# Patient Record
Sex: Male | Born: 1970 | Race: White | Hispanic: No | Marital: Married | Smoking: Former smoker
Health system: Southern US, Community
[De-identification: ages and names within clinical notes are randomized; demographics above are authoritative.]

## PROBLEM LIST (undated history)

## (undated) DIAGNOSIS — N200 Calculus of kidney: Secondary | ICD-10-CM

## (undated) DIAGNOSIS — K219 Gastro-esophageal reflux disease without esophagitis: Secondary | ICD-10-CM

## (undated) DIAGNOSIS — F329 Major depressive disorder, single episode, unspecified: Secondary | ICD-10-CM

## (undated) DIAGNOSIS — F32A Depression, unspecified: Secondary | ICD-10-CM

## (undated) DIAGNOSIS — F419 Anxiety disorder, unspecified: Secondary | ICD-10-CM

## (undated) DIAGNOSIS — T7840XA Allergy, unspecified, initial encounter: Secondary | ICD-10-CM

## (undated) HISTORY — DX: Major depressive disorder, single episode, unspecified: F32.9

## (undated) HISTORY — DX: Allergy, unspecified, initial encounter: T78.40XA

## (undated) HISTORY — DX: Depression, unspecified: F32.A

## (undated) HISTORY — DX: Anxiety disorder, unspecified: F41.9

---

## 2000-11-09 ENCOUNTER — Encounter: Payer: Self-pay | Admitting: Emergency Medicine

## 2000-11-09 ENCOUNTER — Emergency Department (HOSPITAL_COMMUNITY): Admission: EM | Admit: 2000-11-09 | Discharge: 2000-11-09 | Payer: Self-pay | Admitting: Emergency Medicine

## 2002-06-15 ENCOUNTER — Encounter: Payer: Self-pay | Admitting: Emergency Medicine

## 2002-06-15 ENCOUNTER — Emergency Department (HOSPITAL_COMMUNITY): Admission: EM | Admit: 2002-06-15 | Discharge: 2002-06-15 | Payer: Self-pay | Admitting: Emergency Medicine

## 2002-06-17 ENCOUNTER — Emergency Department (HOSPITAL_COMMUNITY): Admission: EM | Admit: 2002-06-17 | Discharge: 2002-06-17 | Payer: Self-pay | Admitting: Emergency Medicine

## 2003-11-25 ENCOUNTER — Emergency Department (HOSPITAL_COMMUNITY): Admission: EM | Admit: 2003-11-25 | Discharge: 2003-11-26 | Payer: Self-pay | Admitting: Emergency Medicine

## 2005-12-05 ENCOUNTER — Emergency Department (HOSPITAL_COMMUNITY): Admission: EM | Admit: 2005-12-05 | Discharge: 2005-12-05 | Payer: Self-pay | Admitting: Emergency Medicine

## 2005-12-06 ENCOUNTER — Emergency Department (HOSPITAL_COMMUNITY): Admission: EM | Admit: 2005-12-06 | Discharge: 2005-12-06 | Payer: Self-pay | Admitting: Emergency Medicine

## 2007-05-09 ENCOUNTER — Emergency Department: Payer: Self-pay | Admitting: Emergency Medicine

## 2009-12-19 ENCOUNTER — Emergency Department (HOSPITAL_COMMUNITY): Admission: EM | Admit: 2009-12-19 | Discharge: 2009-12-19 | Payer: Self-pay | Admitting: Emergency Medicine

## 2010-05-31 ENCOUNTER — Inpatient Hospital Stay (INDEPENDENT_AMBULATORY_CARE_PROVIDER_SITE_OTHER)
Admission: RE | Admit: 2010-05-31 | Discharge: 2010-05-31 | Disposition: A | Discharge status: Home / Self Care | Payer: Self-pay | Source: Ambulatory Visit | Attending: Family Medicine | Admitting: Family Medicine

## 2010-05-31 DIAGNOSIS — J309 Allergic rhinitis, unspecified: Secondary | ICD-10-CM

## 2010-05-31 DIAGNOSIS — J029 Acute pharyngitis, unspecified: Secondary | ICD-10-CM

## 2010-07-18 ENCOUNTER — Emergency Department (HOSPITAL_COMMUNITY)
Admission: EM | Admit: 2010-07-18 | Discharge: 2010-07-18 | Disposition: A | Discharge status: Home / Self Care | Payer: Self-pay | Attending: Emergency Medicine | Admitting: Emergency Medicine

## 2010-07-18 DIAGNOSIS — L298 Other pruritus: Secondary | ICD-10-CM | POA: Insufficient documentation

## 2010-07-18 DIAGNOSIS — L02419 Cutaneous abscess of limb, unspecified: Secondary | ICD-10-CM | POA: Insufficient documentation

## 2010-07-18 DIAGNOSIS — G8929 Other chronic pain: Secondary | ICD-10-CM | POA: Insufficient documentation

## 2010-07-18 DIAGNOSIS — L2989 Other pruritus: Secondary | ICD-10-CM | POA: Insufficient documentation

## 2010-07-18 DIAGNOSIS — R197 Diarrhea, unspecified: Secondary | ICD-10-CM | POA: Insufficient documentation

## 2010-07-18 DIAGNOSIS — M79609 Pain in unspecified limb: Secondary | ICD-10-CM | POA: Insufficient documentation

## 2010-07-18 DIAGNOSIS — R112 Nausea with vomiting, unspecified: Secondary | ICD-10-CM | POA: Insufficient documentation

## 2010-07-18 DIAGNOSIS — M549 Dorsalgia, unspecified: Secondary | ICD-10-CM | POA: Insufficient documentation

## 2010-07-18 DIAGNOSIS — L03119 Cellulitis of unspecified part of limb: Secondary | ICD-10-CM | POA: Insufficient documentation

## 2010-07-18 DIAGNOSIS — R42 Dizziness and giddiness: Secondary | ICD-10-CM | POA: Insufficient documentation

## 2010-07-18 DIAGNOSIS — R21 Rash and other nonspecific skin eruption: Secondary | ICD-10-CM | POA: Insufficient documentation

## 2010-08-11 ENCOUNTER — Ambulatory Visit (INDEPENDENT_AMBULATORY_CARE_PROVIDER_SITE_OTHER): Payer: Self-pay

## 2010-08-11 ENCOUNTER — Inpatient Hospital Stay (INDEPENDENT_AMBULATORY_CARE_PROVIDER_SITE_OTHER)
Admission: RE | Admit: 2010-08-11 | Discharge: 2010-08-11 | Disposition: A | Discharge status: Home / Self Care | Payer: Self-pay | Source: Ambulatory Visit | Attending: Family Medicine | Admitting: Family Medicine

## 2010-08-11 DIAGNOSIS — R498 Other voice and resonance disorders: Secondary | ICD-10-CM

## 2010-08-11 DIAGNOSIS — S60229A Contusion of unspecified hand, initial encounter: Secondary | ICD-10-CM

## 2010-11-15 ENCOUNTER — Encounter (HOSPITAL_COMMUNITY): Payer: Self-pay | Admitting: Radiology

## 2010-11-15 ENCOUNTER — Emergency Department (HOSPITAL_COMMUNITY)
Admission: EM | Admit: 2010-11-15 | Discharge: 2010-11-15 | Disposition: A | Discharge status: Home / Self Care | Payer: Medicaid Other | Attending: Emergency Medicine | Admitting: Emergency Medicine

## 2010-11-15 ENCOUNTER — Emergency Department (HOSPITAL_COMMUNITY): Payer: Medicaid Other

## 2010-11-15 DIAGNOSIS — M545 Low back pain, unspecified: Secondary | ICD-10-CM | POA: Insufficient documentation

## 2010-11-15 DIAGNOSIS — R209 Unspecified disturbances of skin sensation: Secondary | ICD-10-CM | POA: Insufficient documentation

## 2010-11-15 DIAGNOSIS — R51 Headache: Secondary | ICD-10-CM | POA: Insufficient documentation

## 2010-11-15 DIAGNOSIS — R112 Nausea with vomiting, unspecified: Secondary | ICD-10-CM | POA: Insufficient documentation

## 2010-11-15 DIAGNOSIS — R42 Dizziness and giddiness: Secondary | ICD-10-CM | POA: Insufficient documentation

## 2010-11-15 DIAGNOSIS — R231 Pallor: Secondary | ICD-10-CM | POA: Insufficient documentation

## 2010-11-15 DIAGNOSIS — R61 Generalized hyperhidrosis: Secondary | ICD-10-CM | POA: Insufficient documentation

## 2010-11-15 DIAGNOSIS — G8929 Other chronic pain: Secondary | ICD-10-CM | POA: Insufficient documentation

## 2010-11-15 DIAGNOSIS — R109 Unspecified abdominal pain: Secondary | ICD-10-CM | POA: Insufficient documentation

## 2010-11-15 HISTORY — DX: Calculus of kidney: N20.0

## 2010-11-15 LAB — DIFFERENTIAL
Basophils Absolute: 0 10*3/uL (ref 0.0–0.1)
Basophils Relative: 0 % (ref 0–1)
Eosinophils Absolute: 0.1 10*3/uL (ref 0.0–0.7)
Eosinophils Relative: 1 % (ref 0–5)
Lymphocytes Relative: 6 % — ABNORMAL LOW (ref 12–46)
Lymphs Abs: 1 10*3/uL (ref 0.7–4.0)
Monocytes Absolute: 1.1 10*3/uL — ABNORMAL HIGH (ref 0.1–1.0)
Monocytes Relative: 7 % (ref 3–12)
Neutro Abs: 13.6 10*3/uL — ABNORMAL HIGH (ref 1.7–7.7)
Neutrophils Relative %: 86 % — ABNORMAL HIGH (ref 43–77)

## 2010-11-15 LAB — CBC
HCT: 42.2 % (ref 39.0–52.0)
Hemoglobin: 15 g/dL (ref 13.0–17.0)
MCH: 31 pg (ref 26.0–34.0)
MCHC: 35.5 g/dL (ref 30.0–36.0)
MCV: 87.2 fL (ref 78.0–100.0)
Platelets: 271 10*3/uL (ref 150–400)
RBC: 4.84 MIL/uL (ref 4.22–5.81)
RDW: 12.1 % (ref 11.5–15.5)
WBC: 15.8 10*3/uL — ABNORMAL HIGH (ref 4.0–10.5)

## 2010-11-15 LAB — URINALYSIS, ROUTINE W REFLEX MICROSCOPIC
Bilirubin Urine: NEGATIVE
Glucose, UA: NEGATIVE mg/dL
Hgb urine dipstick: NEGATIVE
Ketones, ur: NEGATIVE mg/dL
Leukocytes, UA: NEGATIVE
Nitrite: NEGATIVE
Protein, ur: NEGATIVE mg/dL
Specific Gravity, Urine: 1.018 (ref 1.005–1.030)
Urobilinogen, UA: 0.2 mg/dL (ref 0.0–1.0)
pH: 8.5 — ABNORMAL HIGH (ref 5.0–8.0)

## 2010-11-15 LAB — BASIC METABOLIC PANEL
BUN: 13 mg/dL (ref 6–23)
CO2: 26 mEq/L (ref 19–32)
Calcium: 9.5 mg/dL (ref 8.4–10.5)
Chloride: 101 mEq/L (ref 96–112)
Creatinine, Ser: 0.82 mg/dL (ref 0.50–1.35)
GFR calc Af Amer: >60 mL/min (ref >60)
GFR calc non Af Amer: >60 mL/min (ref >60)
Glucose, Bld: 107 mg/dL — ABNORMAL HIGH (ref 70–99)
Potassium: 4.2 mEq/L (ref 3.5–5.1)
Sodium: 136 mEq/L (ref 135–145)

## 2010-11-15 MED ORDER — IOHEXOL 300 MG/ML  SOLN
100.0000 mL | Freq: Once | INTRAMUSCULAR | Status: AC | PRN
  Administered 2010-11-15: 100 mL via INTRAVENOUS

## 2011-02-19 ENCOUNTER — Encounter (HOSPITAL_COMMUNITY): Payer: Self-pay | Admitting: Emergency Medicine

## 2011-02-19 ENCOUNTER — Emergency Department (HOSPITAL_COMMUNITY): Payer: Self-pay

## 2011-02-19 ENCOUNTER — Emergency Department (HOSPITAL_COMMUNITY)
Admission: EM | Admit: 2011-02-19 | Discharge: 2011-02-19 | Disposition: A | Discharge status: Home / Self Care | Payer: Self-pay | Attending: Emergency Medicine | Admitting: Emergency Medicine

## 2011-02-19 DIAGNOSIS — W2209XA Striking against other stationary object, initial encounter: Secondary | ICD-10-CM | POA: Insufficient documentation

## 2011-02-19 DIAGNOSIS — S60229A Contusion of unspecified hand, initial encounter: Secondary | ICD-10-CM | POA: Insufficient documentation

## 2011-02-19 DIAGNOSIS — S60221A Contusion of right hand, initial encounter: Secondary | ICD-10-CM

## 2011-02-19 MED ORDER — IBUPROFEN 600 MG PO TABS: 600.0000 mg | ORAL_TABLET | Freq: Four times a day (QID) | ORAL | Status: AC | PRN

## 2011-02-19 MED ORDER — TRAMADOL HCL 50 MG PO TABS: 50.0000 mg | ORAL_TABLET | Freq: Four times a day (QID) | ORAL | Status: AC | PRN

## 2011-02-19 NOTE — ED Notes (Signed)
Pt presented to the ER with c/o right hand pain secondary to the hand injury, yesterday, pt states he punched the toilet paper stand while joking with his friends, noted swelling to the middle of the metacarpolpharngial area, pt with decreased digit range, pulses present and strong bilaterally. Cap refill <3 sec

## 2011-02-19 NOTE — ED Provider Notes (Signed)
History     CSN: [id number]  Arrival date & time 02/19/11  2022   First MD Initiated Contact with Patient 02/19/11 2053      Chief Complaint  Patient presents with  . Hand Injury  . Hand Pain    (Consider location/radiation/quality/duration/timing/severity/associated sxs/prior treatment) Patient is a 40 y.o. male presenting with hand injury. The history is provided by the patient.  Hand Injury  The incident occurred yesterday. The injury mechanism was a direct blow. The pain is present in the right hand. The quality of the pain is described as aching. The pain is at a severity of 7/10. The pain is moderate. The pain has been constant since the incident. Pertinent negatives include no malaise/fatigue. He reports no foreign bodies present. The symptoms are aggravated by movement, palpation and use. He has tried nothing for the symptoms.  Pt reports punching a toilet paper dispenser. No other injuries or complaints. Did not try any medications or icing it.  Past Medical History  Diagnosis Date  . Kidney stones     History reviewed. No pertinent past surgical history.  History reviewed. No pertinent family history.  History  Substance Use Topics  . Smoking status: Never Smoker   . Smokeless tobacco: Not on file  . Alcohol Use: No      Review of Systems  Constitutional: Negative for malaise/fatigue.  Musculoskeletal:       Hand injury  All other systems reviewed and are negative.    Allergies  Phenergan  Home Medications  No current outpatient prescriptions on file.  BP 127/79  Pulse 82  Temp(Src) 98.3 F (36.8 C) (Oral)  Resp 20  SpO2 98%  Physical Exam  Nursing note and vitals reviewed. Constitutional: He is oriented to person, place, and time. He appears well-developed and well-nourished.  HENT:  Head: Normocephalic and atraumatic.  Eyes: Conjunctivae are normal.  Neck: Neck supple.  Cardiovascular: Normal rate, regular rhythm and normal heart sounds.    Pulmonary/Chest: Effort normal and breath sounds normal. No respiratory distress.  Musculoskeletal:       Small 0.5cm abrasion to the right 3rd knucle of the mcp joint. Mild swelling over the same joint. Tender to palpation. Pain with 3rd finger flexion, extension. Normal rest of the hand and wrist exam.  Neurological: He is alert and oriented to person, place, and time.  Skin: Skin is warm and dry.  Psychiatric: He has a normal mood and affect.    ED Course  Procedures (including critical care time)  Labs Reviewed - No data to display Dg Hand Complete Right  02/19/2011  *RADIOLOGY REPORT*  Clinical Data: Punched wall, with right hand pain.  RIGHT HAND - COMPLETE 3+ VIEW  Comparison: Right hand radiographs performed 08/11/2010  Findings: There is no evidence of fracture or dislocation.  The joint spaces are preserved; the soft tissues are unremarkable in appearance.  The carpal rows are intact, and demonstrate normal alignment.  IMPRESSION: No evidence of fracture or dislocation.  Original Report Authenticated By: Tonia Ghent, M.D.     X-ray negative. ACE wrap given. Small abrasion to the knuckle. Pt denies hitting anyones face. No signs of infection. Bacitracin applied. Will d/c home.    MDM          Lottie Mussel, PA 02/19/11 2202  Lottie Mussel, PA 03/11/11 1534

## 2011-03-15 NOTE — ED Provider Notes (Signed)
Medical screening examination/treatment/procedure(s) were performed by non-physician practitioner and as supervising physician I was immediately available for consultation/collaboration.  Hurman Horn, MD 03/15/11 1230

## 2011-10-03 ENCOUNTER — Ambulatory Visit (INDEPENDENT_AMBULATORY_CARE_PROVIDER_SITE_OTHER): Payer: BC Managed Care – PPO | Admitting: Emergency Medicine

## 2011-10-03 VITALS — BP 117/77 | HR 68 | Temp 97.8°F | Resp 16 | Ht 72.0 in | Wt 209.0 lb

## 2011-10-03 DIAGNOSIS — M722 Plantar fascial fibromatosis: Secondary | ICD-10-CM

## 2011-10-03 MED ORDER — NAPROXEN SODIUM 550 MG PO TABS: 550.0000 mg | ORAL_TABLET | Freq: Two times a day (BID) | ORAL | Status: DC

## 2011-10-03 NOTE — Progress Notes (Signed)
   Date:  10/03/2011   Name:  Carlos Taylor   DOB:  [birth year]   MRN:  [id number]  PCP:  Kaleen Mask, MD    Chief Complaint: Foot Pain   History of Present Illness:  Carlos Taylor is a 40 y.o. very pleasant male patient who presents with the following:  Several month history of pain in heels when he gets up in the morning.  So severe lately that he can barely walk until he 'warms up'.  Pain diminishes with activity.   No history of injury.  There is no problem list on file for this patient.   Past Medical History  Diagnosis Date  . Kidney stones     No past surgical history on file.  History  Substance Use Topics  . Smoking status: Never Smoker   . Smokeless tobacco: Not on file  . Alcohol Use: No    No family history on file.  Allergies  Allergen Reactions  . Promethazine Hcl     Rash to the lip    Medication list has been reviewed and updated.  No current outpatient prescriptions on file prior to visit.    Review of Systems:  As per HPI, otherwise negative.    Physical Examination: Filed Vitals:   10/03/11 1958  BP: 117/77  Pulse: 68  Temp: 97.8 F (36.6 C)  Resp: 16   Filed Vitals:   10/03/11 1958  Height: 6' (1.829 m)  Weight: 209 lb (94.802 kg)   Body mass index is 28.35 kg/(m^2). Ideal Body Weight: Weight in (lb) to have BMI = 25: 183.9    GEN: WDWN, NAD, Non-toxic, Alert & Oriented x 3 HEENT: Atraumatic, Normocephalic.  Ears and Nose: No external deformity. EXTR: No clubbing/cyanosis/edema NEURO: Normal gait.  PSYCH: Normally interactive. Conversant. Not depressed or anxious appearing.  Calm demeanor.  Feet.  Marked tenderness anterior to heel.    Assessment and Plan: Plantar fasciitis Podiatry Offered injection refused Anaprox ds Follow up as needed  Carmelina Dane, MD

## 2011-10-04 ENCOUNTER — Telehealth: Payer: Self-pay

## 2011-10-04 NOTE — Telephone Encounter (Signed)
PT STATES HE WAS SEEN FOR FOOT PAIN AND HIS FEET IS STILL REALLY PAINFUL. WAS GIVEN SOME MEDICINE BUT NOT FOR THE PAIN PLEASE CALL [phone]    [facility] [address]

## 2011-10-04 NOTE — Telephone Encounter (Signed)
Pt wife called again, pt is working but in pain.

## 2011-10-05 MED ORDER — TRAMADOL HCL 50 MG PO TABS: 50.0000 mg | ORAL_TABLET | Freq: Three times a day (TID) | ORAL | Status: AC | PRN

## 2011-10-05 NOTE — Telephone Encounter (Signed)
Can we recommend anything for the pain?

## 2011-10-05 NOTE — Telephone Encounter (Signed)
Spoke with patients wife and he is taking it as directed with alittle relief.  Would like stronger meds, doesn't think Ultram will work, but advised to try it first and see.  Rx already sent in.

## 2011-10-05 NOTE — Telephone Encounter (Signed)
lmom for pt to cb

## 2011-10-05 NOTE — Telephone Encounter (Signed)
The medication he was given is both to treat the inflammation and to help with the pain. Is he taking it as directed? I can also authorize Ultram as needed for pain.

## 2011-11-05 ENCOUNTER — Encounter (HOSPITAL_COMMUNITY): Payer: Self-pay | Admitting: *Deleted

## 2011-11-05 ENCOUNTER — Emergency Department (HOSPITAL_COMMUNITY)
Admission: EM | Admit: 2011-11-05 | Discharge: 2011-11-05 | Disposition: A | Discharge status: Home / Self Care | Payer: BC Managed Care – PPO | Attending: Emergency Medicine | Admitting: Emergency Medicine

## 2011-11-05 ENCOUNTER — Emergency Department (HOSPITAL_COMMUNITY): Payer: BC Managed Care – PPO

## 2011-11-05 DIAGNOSIS — K219 Gastro-esophageal reflux disease without esophagitis: Secondary | ICD-10-CM | POA: Insufficient documentation

## 2011-11-05 DIAGNOSIS — R109 Unspecified abdominal pain: Secondary | ICD-10-CM | POA: Insufficient documentation

## 2011-11-05 DIAGNOSIS — Z87442 Personal history of urinary calculi: Secondary | ICD-10-CM | POA: Insufficient documentation

## 2011-11-05 DIAGNOSIS — N200 Calculus of kidney: Secondary | ICD-10-CM | POA: Insufficient documentation

## 2011-11-05 DIAGNOSIS — R110 Nausea: Secondary | ICD-10-CM | POA: Insufficient documentation

## 2011-11-05 DIAGNOSIS — R319 Hematuria, unspecified: Secondary | ICD-10-CM | POA: Insufficient documentation

## 2011-11-05 DIAGNOSIS — Z79899 Other long term (current) drug therapy: Secondary | ICD-10-CM | POA: Insufficient documentation

## 2011-11-05 HISTORY — DX: Gastro-esophageal reflux disease without esophagitis: K21.9

## 2011-11-05 LAB — URINALYSIS, ROUTINE W REFLEX MICROSCOPIC
Bilirubin Urine: NEGATIVE
Glucose, UA: NEGATIVE mg/dL
Ketones, ur: NEGATIVE mg/dL
Leukocytes, UA: NEGATIVE
Nitrite: NEGATIVE
Protein, ur: NEGATIVE mg/dL
Specific Gravity, Urine: 1.026 (ref 1.005–1.030)
Urobilinogen, UA: 0.2 mg/dL (ref 0.0–1.0)
pH: 5.5 (ref 5.0–8.0)

## 2011-11-05 LAB — POCT I-STAT, CHEM 8
Creatinine, Ser: 1.1 mg/dL (ref 0.50–1.35)
Glucose, Bld: 122 mg/dL — ABNORMAL HIGH (ref 70–99)
Hemoglobin: 15 g/dL (ref 13.0–17.0)
Sodium: 141 mEq/L (ref 135–145)
TCO2: 26 mmol/L (ref 0–100)

## 2011-11-05 LAB — CBC WITH DIFFERENTIAL/PLATELET
Basophils Relative: 1 % (ref 0–1)
Eosinophils Absolute: 0.3 10*3/uL (ref 0.0–0.7)
Eosinophils Relative: 5 % (ref 0–5)
Lymphs Abs: 2.6 10*3/uL (ref 0.7–4.0)
MCH: 30.3 pg (ref 26.0–34.0)
MCHC: 34.4 g/dL (ref 30.0–36.0)
MCV: 88.1 fL (ref 78.0–100.0)
Platelets: 276 10*3/uL (ref 150–400)
RBC: 4.78 MIL/uL (ref 4.22–5.81)

## 2011-11-05 LAB — URINE MICROSCOPIC-ADD ON

## 2011-11-05 MED ORDER — ONDANSETRON HCL 4 MG/2ML IJ SOLN
4.0000 mg | Freq: Once | INTRAMUSCULAR | Status: AC
  Administered 2011-11-05: 4 mg via INTRAVENOUS
  Filled 2011-11-05: qty 2

## 2011-11-05 MED ORDER — HYDROCODONE-ACETAMINOPHEN 5-500 MG PO TABS: 1.0000 | ORAL_TABLET | Freq: Four times a day (QID) | ORAL | Status: AC | PRN

## 2011-11-05 MED ORDER — KETOROLAC TROMETHAMINE 30 MG/ML IJ SOLN
30.0000 mg | Freq: Once | INTRAMUSCULAR | Status: AC
  Administered 2011-11-05: 30 mg via INTRAVENOUS
  Filled 2011-11-05: qty 1

## 2011-11-05 MED ORDER — HYDROMORPHONE HCL PF 1 MG/ML IJ SOLN
1.0000 mg | Freq: Once | INTRAMUSCULAR | Status: AC
  Administered 2011-11-05: 1 mg via INTRAVENOUS
  Filled 2011-11-05: qty 1

## 2011-11-05 MED ORDER — SODIUM CHLORIDE 0.9 % IV BOLUS (SEPSIS)
1000.0000 mL | Freq: Once | INTRAVENOUS | Status: AC
  Administered 2011-11-05: 1000 mL via INTRAVENOUS

## 2011-11-05 MED ORDER — IBUPROFEN 800 MG PO TABS: 800.0000 mg | ORAL_TABLET | Freq: Three times a day (TID) | ORAL | Status: AC

## 2011-11-05 MED ORDER — TAMSULOSIN HCL 0.4 MG PO CAPS: 0.4000 mg | ORAL_CAPSULE | Freq: Two times a day (BID) | ORAL | Status: DC

## 2011-11-05 NOTE — ED Notes (Signed)
Pt experienced nausea at 3 pm.  Around 1030 pm he felt R flank pain that was intense and lasted about 30 minutes.  The pain returned, this time R inguinal and dropped him to the floor with the intensity. Pt with hx of renal calculi and both kidneys are on the R side.  Pt also c/o normal bm's that are green.

## 2011-11-05 NOTE — ED Provider Notes (Signed)
History     CSN: [id number]  Arrival date & time 11/05/11  0047   First MD Initiated Contact with Patient 11/05/11 0344      Chief Complaint  Patient presents with  . Flank Pain    (Consider location/radiation/quality/duration/timing/severity/associated sxs/prior treatment) HPI Comments: 41 year old male with a history of kidney stone and agenesis of the left kidney who presents with a complaint of right flank pain. This was acute in onset several hours ago, intermittent, has become worsened in the last hour. He has associated nausea. The symptoms are persistent, sharp and stabbing, radiating from the right flank to the right lower quadrant to the right scrotum. This is similar to pain he is in the past. He does note a sense of dark urine over the last several days. He did not require urinary tract manipulation to remove the last stone.  Patient is a 41 y.o. male presenting with flank pain. The history is provided by the patient and the spouse.  Flank Pain    Past Medical History  Diagnosis Date  . Kidney stones   . GERD (gastroesophageal reflux disease)     History reviewed. No pertinent past surgical history.  History reviewed. No pertinent family history.  History  Substance Use Topics  . Smoking status: Former Games developer  . Smokeless tobacco: Not on file  . Alcohol Use: No      Review of Systems  Genitourinary: Positive for flank pain.  All other systems reviewed and are negative.    Allergies  Promethazine hcl  Home Medications   Current Outpatient Rx  Name Route Sig Dispense Refill  . NAPROXEN SODIUM 550 MG PO TABS Oral Take 1 tablet (550 mg total) by mouth 2 (two) times daily with a meal. 60 tablet 0  . TRAMADOL HCL 50 MG PO TABS Oral Take 50 mg by mouth every 6 (six) hours as needed. For pain    . HYDROCODONE-ACETAMINOPHEN 5-500 MG PO TABS Oral Take 1-2 tablets by mouth every 6 (six) hours as needed for pain. 15 tablet 0  . IBUPROFEN 800 MG PO TABS Oral  Take 1 tablet (800 mg total) by mouth 3 (three) times daily. 21 tablet 0  . TAMSULOSIN HCL 0.4 MG PO CAPS Oral Take 1 capsule (0.4 mg total) by mouth 2 (two) times daily. 10 capsule 0    BP 115/72  Pulse 89  Temp 97.8 F (36.6 C) (Oral)  Resp 18  SpO2 98%  Physical Exam  Nursing note and vitals reviewed. Constitutional: He appears well-developed and well-nourished. No distress.  HENT:  Head: Normocephalic and atraumatic.  Mouth/Throat: Oropharynx is clear and moist. No oropharyngeal exudate.  Eyes: Conjunctivae and EOM are normal. Pupils are equal, round, and reactive to light. Right eye exhibits no discharge. Left eye exhibits no discharge. No scleral icterus.  Neck: Normal range of motion. Neck supple. No JVD present. No thyromegaly present.  Cardiovascular: Normal rate, regular rhythm, normal heart sounds and intact distal pulses.  Exam reveals no gallop and no friction rub.   No murmur heard. Pulmonary/Chest: Effort normal and breath sounds normal. No respiratory distress. He has no wheezes. He has no rales.  Abdominal: Soft. Bowel sounds are normal. He exhibits no distension and no mass. There is no tenderness.       No CVA tenderness, no abdominal tenderness  Genitourinary:       Normal appearing scrotum and testicles bilaterally, no hernia palpated  Musculoskeletal: Normal range of motion. He exhibits no edema and  no tenderness.  Lymphadenopathy:    He has no cervical adenopathy.  Neurological: He is alert. Coordination normal.  Skin: Skin is warm and dry. No rash noted. No erythema.  Psychiatric: He has a normal mood and affect. His behavior is normal.    ED Course  Procedures (including critical care time)  Labs Reviewed  URINALYSIS, ROUTINE W REFLEX MICROSCOPIC - Abnormal; Notable for the following:    Color, Urine AMBER (*)  BIOCHEMICALS MAY BE AFFECTED BY COLOR   APPearance CLOUDY (*)     Hgb urine dipstick LARGE (*)     All other components within normal limits    POCT I-STAT, CHEM 8 - Abnormal; Notable for the following:    Glucose, Bld 122 (*)     All other components within normal limits  URINE MICROSCOPIC-ADD ON - Abnormal; Notable for the following:    Bacteria, UA FEW (*)     All other components within normal limits  CBC WITH DIFFERENTIAL   Ct Abdomen Pelvis Wo Contrast  11/05/2011  *RADIOLOGY REPORT*  Clinical Data: Right-sided abdominal pain and nausea.  CT ABDOMEN AND PELVIS WITHOUT CONTRAST  Technique:  Multidetector CT imaging of the abdomen and pelvis was performed following the standard protocol without intravenous contrast.  Comparison: 11/15/2010  Findings: Mild dependent changes in the lung bases.  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  Right renal hypertrophy.  No pyelocaliectasis or ureterectasis.  Mildly hyperdense exophytic structure arising from the posterior midportion of the right kidney measuring 1.9 cm diameter.  This is stable in size and corresponds to the cyst seen on previous study.  Likely represents hemorrhagic cyst.  No renal or ureteral stones demonstrated.  Calcifications in the pelvis appear stable since previous study and are consistent with phleboliths.  No bladder stone or bladder wall thickening.  The unenhanced appearance of the liver, spleen, gallbladder, pancreas, adrenal glands, abdominal aorta, and retroperitoneal lymph nodes is unremarkable.  Mild prominence of celiac axis lymph nodes, measuring up to 8 mm diameter.  These are stable since previous study are likely reactive.  Prominent visceral adipose tissues.  The stomach, small bowel, and colon are not abnormally distended.  No free air or free fluid in the abdomen.  Pelvis:  The appendix is normal.  Prostate gland is not enlarged. No free or loculated pelvic fluid collections.  No diverticulitis. No significant pelvic lymphadenopathy.  Degenerative changes in the spine. No significant changes since previous study.   IMPRESSION: Absence of the left kidney.  No right renal or ureteral stone or obstruction.  Probable hemorrhagic cyst on the right, stable in size since previous study.   Original Report Authenticated By: Marlon Pel, M.D.      1. Kidney stone on right side   2. Hematuria       MDM  The patient only appears mildly uncomfortable at this time, rates pain 7/10 and has normal vital signs. As the CT scan pending to rule out kidney stone, does have history of significant trauma kidney including only one kidney. It does appear to be functioning appropriately given creatinine level.   Pain improved, CT scan shows no signs of acute kidney stone, given the hematuria the patient's clinical symptoms over the last several days I suspect that he has had very small stones that he has passed successfully. He has no signs of obstruction, medications given when necessary as needed for home.  Discharge Prescriptions include:  Ibuprofen Hydrocodone flomax  Vida Roller, MD 11/05/11 [phone]

## 2012-03-08 ENCOUNTER — Emergency Department (HOSPITAL_COMMUNITY): Payer: BC Managed Care – PPO

## 2012-03-08 ENCOUNTER — Encounter (HOSPITAL_COMMUNITY): Payer: Self-pay | Admitting: Emergency Medicine

## 2012-03-08 ENCOUNTER — Emergency Department (HOSPITAL_COMMUNITY)
Admission: EM | Admit: 2012-03-08 | Discharge: 2012-03-08 | Disposition: A | Discharge status: Home / Self Care | Payer: BC Managed Care – PPO | Attending: Emergency Medicine | Admitting: Emergency Medicine

## 2012-03-08 DIAGNOSIS — M545 Low back pain, unspecified: Secondary | ICD-10-CM | POA: Insufficient documentation

## 2012-03-08 DIAGNOSIS — M549 Dorsalgia, unspecified: Secondary | ICD-10-CM

## 2012-03-08 DIAGNOSIS — R110 Nausea: Secondary | ICD-10-CM | POA: Insufficient documentation

## 2012-03-08 DIAGNOSIS — R05 Cough: Secondary | ICD-10-CM | POA: Insufficient documentation

## 2012-03-08 DIAGNOSIS — R059 Cough, unspecified: Secondary | ICD-10-CM | POA: Insufficient documentation

## 2012-03-08 DIAGNOSIS — R062 Wheezing: Secondary | ICD-10-CM | POA: Insufficient documentation

## 2012-03-08 DIAGNOSIS — E860 Dehydration: Secondary | ICD-10-CM | POA: Insufficient documentation

## 2012-03-08 DIAGNOSIS — R0602 Shortness of breath: Secondary | ICD-10-CM | POA: Insufficient documentation

## 2012-03-08 DIAGNOSIS — J029 Acute pharyngitis, unspecified: Secondary | ICD-10-CM | POA: Insufficient documentation

## 2012-03-08 DIAGNOSIS — Z87442 Personal history of urinary calculi: Secondary | ICD-10-CM | POA: Insufficient documentation

## 2012-03-08 DIAGNOSIS — Z8719 Personal history of other diseases of the digestive system: Secondary | ICD-10-CM | POA: Insufficient documentation

## 2012-03-08 DIAGNOSIS — R252 Cramp and spasm: Secondary | ICD-10-CM | POA: Insufficient documentation

## 2012-03-08 DIAGNOSIS — R42 Dizziness and giddiness: Secondary | ICD-10-CM | POA: Insufficient documentation

## 2012-03-08 DIAGNOSIS — R630 Anorexia: Secondary | ICD-10-CM | POA: Insufficient documentation

## 2012-03-08 DIAGNOSIS — R6883 Chills (without fever): Secondary | ICD-10-CM | POA: Insufficient documentation

## 2012-03-08 DIAGNOSIS — Z87891 Personal history of nicotine dependence: Secondary | ICD-10-CM | POA: Insufficient documentation

## 2012-03-08 DIAGNOSIS — R52 Pain, unspecified: Secondary | ICD-10-CM | POA: Insufficient documentation

## 2012-03-08 LAB — COMPREHENSIVE METABOLIC PANEL
Alkaline Phosphatase: 62 U/L (ref 39–117)
BUN: 9 mg/dL (ref 6–23)
Chloride: 101 mEq/L (ref 96–112)
GFR calc Af Amer: >90 mL/min (ref >90)
Glucose, Bld: 143 mg/dL — ABNORMAL HIGH (ref 70–99)
Potassium: 3.6 mEq/L (ref 3.5–5.1)
Total Bilirubin: 0.3 mg/dL (ref 0.3–1.2)

## 2012-03-08 LAB — URINALYSIS, ROUTINE W REFLEX MICROSCOPIC
Glucose, UA: NEGATIVE mg/dL
Ketones, ur: NEGATIVE mg/dL
Leukocytes, UA: NEGATIVE
Nitrite: NEGATIVE
Protein, ur: NEGATIVE mg/dL

## 2012-03-08 LAB — CBC WITH DIFFERENTIAL/PLATELET
Basophils Relative: 0 % (ref 0–1)
HCT: 41.2 % (ref 39.0–52.0)
Hemoglobin: 14 g/dL (ref 13.0–17.0)
Lymphocytes Relative: 31 % (ref 12–46)
Monocytes Relative: 8 % (ref 3–12)
Neutro Abs: 1.4 10*3/uL — ABNORMAL LOW (ref 1.7–7.7)
WBC: 2.3 10*3/uL — ABNORMAL LOW (ref 4.0–10.5)

## 2012-03-08 LAB — RAPID STREP SCREEN (MED CTR MEBANE ONLY): Streptococcus, Group A Screen (Direct): NEGATIVE

## 2012-03-08 MED ORDER — ONDANSETRON HCL 4 MG/2ML IJ SOLN
4.0000 mg | Freq: Once | INTRAMUSCULAR | Status: AC
  Administered 2012-03-08: 4 mg via INTRAVENOUS
  Filled 2012-03-08: qty 2

## 2012-03-08 MED ORDER — SODIUM CHLORIDE 0.9 % IV SOLN: 1000.0000 mL | INTRAVENOUS | Status: DC

## 2012-03-08 MED ORDER — KETOROLAC TROMETHAMINE 30 MG/ML IJ SOLN
30.0000 mg | Freq: Once | INTRAMUSCULAR | Status: AC
  Administered 2012-03-08: 30 mg via INTRAVENOUS
  Filled 2012-03-08: qty 1

## 2012-03-08 MED ORDER — ONDANSETRON HCL 4 MG PO TABS: 4.0000 mg | ORAL_TABLET | Freq: Four times a day (QID) | ORAL | Status: DC

## 2012-03-08 MED ORDER — SODIUM CHLORIDE 0.9 % IV BOLUS (SEPSIS)
1000.0000 mL | Freq: Once | INTRAVENOUS | Status: AC
  Administered 2012-03-08: 1000 mL via INTRAVENOUS

## 2012-03-08 MED ORDER — CYCLOBENZAPRINE HCL 5 MG PO TABS: 5.0000 mg | ORAL_TABLET | Freq: Three times a day (TID) | ORAL | Status: DC | PRN

## 2012-03-08 MED ORDER — SODIUM CHLORIDE 0.9 % IV SOLN
1000.0000 mL | Freq: Once | INTRAVENOUS | Status: AC
  Administered 2012-03-08: 1000 mL via INTRAVENOUS

## 2012-03-08 MED ORDER — OSELTAMIVIR PHOSPHATE 75 MG PO CAPS: 75.0000 mg | ORAL_CAPSULE | Freq: Two times a day (BID) | ORAL | Status: DC

## 2012-03-08 MED ORDER — TRAMADOL HCL 50 MG PO TABS: 100.0000 mg | ORAL_TABLET | Freq: Four times a day (QID) | ORAL | Status: DC | PRN

## 2012-03-08 NOTE — ED Notes (Signed)
Pt reports taking tylenol this morning, had fever at 0700

## 2012-03-08 NOTE — ED Notes (Signed)
Pt given urinal.

## 2012-03-08 NOTE — ED Provider Notes (Signed)
History     CSN: [id number]  Arrival date & time 03/08/12  8295   First MD Initiated Contact with Patient 03/08/12 [phone]      Chief Complaint  Patient presents with  . Diarrhea  . Generalized Body Aches    (Consider location/radiation/quality/duration/timing/severity/associated sxs/prior treatment) HPI  Patient reports 2 days ago he started having a sore throat and muscle cramping in his body. 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 His wife reports he's had fever up to 102 and she is monitoring his temperature every 4 hours and giving him over-the-counter Tylenol cold and flu medication. Patient states he's having chills. He's having nausea and decreased appetite. He had diarrhea yesterday and a few today. He states he feels lightheaded and has a cough that's productive of white sputum. He states he feels short of breath. His wife states he wheezes when he sleeps. He also complains that he had pain 2 days ago when he was at work moving a box and now he has pain in his lower back area.  Patient also reports he has been having bad GERD symptoms. He however has stopped taking any medications over-the-counter, except for TUMS  Patient reports about 7 or 8 years ago he found out he has both kidneys on his right side. He was told one was not functioning well. He also has had a history of kidney stones.  PCP Dr. Jeannetta Nap  Past Medical History  Diagnosis Date  . Kidney stones   . GERD (gastroesophageal reflux disease)     History reviewed. No pertinent past surgical history.  History reviewed. No pertinent family history.  History  Substance Use Topics  . Smoking status: Former Games developer  . Smokeless tobacco: Not on file  . Alcohol Use: No   Lives at home Lives with spouse employed  Review of Systems  All other systems reviewed and are negative.    Allergies  Promethazine hcl  Home Medications    Current Outpatient Rx  Name  Route  Sig  Dispense  Refill  . PHENYLEPHRINE-DM-GG-APAP 5-10-200-325 MG/15ML PO LIQD   Oral   Take 30 mLs by mouth every 4 (four) hours as needed. Cold symptoms           BP 143/79  Pulse 88  Temp 97.9 F (36.6 C) (Oral)  Resp 16  SpO2 97%  Vital signs normal    Physical Exam  Nursing note and vitals reviewed. Constitutional: He is oriented to person, place, and time. He appears well-developed and well-nourished.  Non-toxic appearance. He does not appear ill. No distress.  HENT:  Head: Normocephalic and atraumatic.  Right Ear: External ear normal.  Left Ear: External ear normal.  Nose: Nose normal. No mucosal edema or rhinorrhea.  Mouth/Throat: Mucous membranes are dry. No dental abscesses or uvula swelling. Posterior oropharyngeal erythema present. No oropharyngeal exudate, posterior oropharyngeal edema or tonsillar abscesses.  Eyes: Conjunctivae normal and EOM are normal. Pupils are equal, round, and reactive to light.  Neck: Normal range of motion and full passive range of motion without pain. Neck supple.  Cardiovascular: Normal rate, regular rhythm and normal heart sounds.  Exam reveals no gallop and no friction rub.   No murmur heard. Pulmonary/Chest: Effort normal and breath sounds normal. No respiratory distress. He has no wheezes. He has no rhonchi. He has no rales. He exhibits no tenderness and no crepitus.  Abdominal: Soft.  Normal appearance and bowel sounds are normal. He exhibits no distension. There is no tenderness. There is no rebound and no guarding.  Musculoskeletal: Normal range of motion. He exhibits no edema and no tenderness.       Back:       Moves all extremities well. Area of pain noted in the sacral region, his pain is in the sacrum, not in the lumbar spine  Neurological: He is alert and oriented to person, place, and time. He has normal strength. No cranial nerve deficit.  Skin: Skin is intact. No rash noted. No  erythema. No pallor.       Clammy and warm to touch  Psychiatric: He has a normal mood and affect. His speech is normal and behavior is normal. His mood appears not anxious.    ED Course  Procedures (including critical care time)   Medications  0.9 %  sodium chloride infusion (0 mL Intravenous Stopped 03/08/12 1016)    Followed by  0.9 %  sodium chloride infusion (0 mL Intravenous Stopped 03/08/12 1132)    Followed by  0.9 %  sodium chloride infusion (not administered)  ondansetron (ZOFRAN) injection 4 mg (4 mg Intravenous Given 03/08/12 0919)  ketorolac (TORADOL) 30 MG/ML injection 30 mg (30 mg Intravenous Given 03/08/12 1028)  sodium chloride 0.9 % bolus 1,000 mL (1000 mL Intravenous New Bag/Given 03/08/12 1133)   Pt didn't have urine output after 2 liters of NS. Given 3 liters of NS. Pt eating ice chips.   Results for orders placed during the hospital encounter of 03/08/12  RAPID STREP SCREEN      Component Value Range   Streptococcus, Group A Screen (Direct) NEGATIVE  NEGATIVE  CBC WITH DIFFERENTIAL      Component Value Range   WBC 2.3 (*) 4.0 - 10.5 K/uL   RBC 4.65  4.22 - 5.81 MIL/uL   Hemoglobin 14.0  13.0 - 17.0 g/dL   HCT 41.3  24.4 - 01.0 %   MCV 88.6  78.0 - 100.0 fL   MCH 30.1  26.0 - 34.0 pg   MCHC 34.0  30.0 - 36.0 g/dL   RDW 27.2  53.6 - 64.4 %   Platelets    150 - 400 K/uL   Value: PLATELET CLUMPS NOTED ON SMEAR, COUNT APPEARS ADEQUATE   Neutrophils Relative 59  43 - 77 %   Lymphocytes Relative 31  12 - 46 %   Monocytes Relative 8  3 - 12 %   Eosinophils Relative 2  0 - 5 %   Basophils Relative 0  0 - 1 %   Neutro Abs 1.4 (*) 1.7 - 7.7 K/uL   Lymphs Abs 0.7  0.7 - 4.0 K/uL   Monocytes Absolute 0.2  0.1 - 1.0 K/uL   Eosinophils Absolute 0.0  0.0 - 0.7 K/uL   Basophils Absolute 0.0  0.0 - 0.1 K/uL   Smear Review MORPHOLOGY UNREMARKABLE    COMPREHENSIVE METABOLIC PANEL      Component Value Range   Sodium 136  135 - 145 mEq/L   Potassium 3.6  3.5 - 5.1 mEq/L     Chloride 101  96 - 112 mEq/L   CO2 25  19 - 32 mEq/L   Glucose, Bld 143 (*) 70 - 99 mg/dL   BUN 9  6 - 23 mg/dL   Creatinine, Ser 0.34  0.50 - 1.35 mg/dL   Calcium 9.0  8.4 - 74.2 mg/dL   Total Protein 7.0  6.0 -  8.3 g/dL   Albumin 3.3 (*) 3.5 - 5.2 g/dL   AST 26  0 - 37 U/L   ALT 38  0 - 53 U/L   Alkaline Phosphatase 62  39 - 117 U/L   Total Bilirubin 0.3  0.3 - 1.2 mg/dL   GFR calc non Af Amer 86 (*) >90 mL/min   GFR calc Af Amer >90  >90 mL/min  URINALYSIS, ROUTINE W REFLEX MICROSCOPIC      Component Value Range   Color, Urine YELLOW  YELLOW   APPearance CLEAR  CLEAR   Specific Gravity, Urine 1.036 (*) 1.005 - 1.030   pH 6.0  5.0 - 8.0   Glucose, UA NEGATIVE  NEGATIVE mg/dL   Hgb urine dipstick NEGATIVE  NEGATIVE   Bilirubin Urine NEGATIVE  NEGATIVE   Ketones, ur NEGATIVE  NEGATIVE mg/dL   Protein, ur NEGATIVE  NEGATIVE mg/dL   Urobilinogen, UA 0.2  0.0 - 1.0 mg/dL   Nitrite NEGATIVE  NEGATIVE   Leukocytes, UA NEGATIVE  NEGATIVE  MAGNESIUM      Component Value Range   Magnesium 1.9  1.5 - 2.5 mg/dL   Laboratory interpretation all normal except low total white blood cell count consistent with viral illness    Dg Chest 2 View  03/08/2012  *RADIOLOGY REPORT*  Clinical Data: Fever.  CHEST - 2 VIEW  Comparison: No priors.  Findings: Lung volumes are normal.  No consolidative airspace disease.  No pleural effusions.  No definite suspicious appearing pulmonary nodules or masses.  Mild diffuse peribronchial cuffing. Pulmonary vasculature and the cardiomediastinal silhouette are within normal limits.  IMPRESSION: 1.  Mild diffuse peribronchial cuffing.  Findings could suggest a mild bronchitis.   Original Report Authenticated By: Trudie Reed, M.D.     1. Influenza-like illness   2. Back pain   3. Dehydration      New Prescriptions   CYCLOBENZAPRINE (FLEXERIL) 5 MG TABLET    Take 1 tablet (5 mg total) by mouth 3 (three) times daily as needed for muscle spasms.    ONDANSETRON (ZOFRAN) 4 MG TABLET    Take 1 tablet (4 mg total) by mouth every 6 (six) hours.   OSELTAMIVIR (TAMIFLU) 75 MG CAPSULE    Take 1 capsule (75 mg total) by mouth every 12 (twelve) hours.   TRAMADOL (ULTRAM) 50 MG TABLET    Take 2 tablets (100 mg total) by mouth every 6 (six) hours as needed for pain.    Plan discharge  Devoria Albe, MD, FACEP    MDM          Ward Givens, MD 03/08/12 1146

## 2012-03-08 NOTE — ED Notes (Signed)
Pt reports back pain, nausea, sore throat, fever, and diarrhea since Friday.  Pt woke this am with RLQ abd pain this am.

## 2013-08-19 ENCOUNTER — Emergency Department (INDEPENDENT_AMBULATORY_CARE_PROVIDER_SITE_OTHER)
Admission: EM | Admit: 2013-08-19 | Discharge: 2013-08-19 | Disposition: A | Discharge status: Home / Self Care | Payer: PRIVATE HEALTH INSURANCE | Source: Home / Self Care | Attending: Family Medicine | Admitting: Family Medicine

## 2013-08-19 ENCOUNTER — Emergency Department (INDEPENDENT_AMBULATORY_CARE_PROVIDER_SITE_OTHER): Payer: PRIVATE HEALTH INSURANCE

## 2013-08-19 ENCOUNTER — Encounter (HOSPITAL_COMMUNITY): Payer: Self-pay | Admitting: Emergency Medicine

## 2013-08-19 DIAGNOSIS — S61409A Unspecified open wound of unspecified hand, initial encounter: Secondary | ICD-10-CM

## 2013-08-19 DIAGNOSIS — IMO0002 Reserved for concepts with insufficient information to code with codable children: Secondary | ICD-10-CM

## 2013-08-19 DIAGNOSIS — S61431A Puncture wound without foreign body of right hand, initial encounter: Secondary | ICD-10-CM

## 2013-08-19 MED ORDER — CEFTRIAXONE SODIUM 1 G IJ SOLR
INTRAMUSCULAR | Status: AC
  Filled 2013-08-19: qty 10

## 2013-08-19 MED ORDER — TRAMADOL HCL 50 MG PO TABS: 50.0000 mg | ORAL_TABLET | Freq: Four times a day (QID) | ORAL | Status: DC | PRN

## 2013-08-19 MED ORDER — CEPHALEXIN 500 MG PO CAPS
500.0000 mg | ORAL_CAPSULE | Freq: Four times a day (QID) | ORAL | Status: DC
Start: 2013-08-19 — End: 2013-09-16

## 2013-08-19 MED ORDER — LIDOCAINE HCL (PF) 1 % IJ SOLN
INTRAMUSCULAR | Status: AC
  Filled 2013-08-19: qty 5

## 2013-08-19 MED ORDER — CEFTRIAXONE SODIUM 1 G IJ SOLR
1.0000 g | INTRAMUSCULAR | Status: DC
  Administered 2013-08-19 (×2): 1 g via INTRAMUSCULAR

## 2013-08-19 MED ORDER — HYDROCODONE-ACETAMINOPHEN 5-325 MG PO TABS: 1.0000 | ORAL_TABLET | Freq: Four times a day (QID) | ORAL | Status: DC | PRN

## 2013-08-19 MED ORDER — SULFAMETHOXAZOLE-TRIMETHOPRIM 800-160 MG PO TABS: 2.0000 | ORAL_TABLET | Freq: Two times a day (BID) | ORAL | Status: DC

## 2013-08-19 NOTE — ED Notes (Signed)
C/o right hand injury States last night he moved a nightstand with a very sharp edge corner The corner pierced center of  patient's right hand States tetanus is updated States area is painful and was not able to sleep since index finger was going numb

## 2013-08-19 NOTE — ED Provider Notes (Addendum)
Carlos AmslerWilliam S Taylor is a 43 y.o. male who presents to Urgent Care today for right hand injury. Patient accidentally hit the ball of his right hand on a sharp edge of his nightstand yesterday about 8 hours prior to presentation. The pain is been worsening since the initial injury. He additionally notes mild numbness and tingling to the distal palmar index finger. He has normal hand motion. He denies any fevers or chills nausea vomiting or diarrhea. He notes his last tetanus vaccination was less than 5 years ago.   Past Medical History  Diagnosis Date  . Kidney stones   . GERD (gastroesophageal reflux disease)    History  Substance Use Topics  . Smoking status: Former Games developermoker  . Smokeless tobacco: Not on file  . Alcohol Use: No   ROS as above Medications: Current Facility-Administered Medications  Medication Dose Route Frequency Provider Last Rate Last Dose  . cefTRIAXone (ROCEPHIN) injection 1 g  1 g Intramuscular Q24H Rodolph BongEvan S Corey, MD   1 g at 08/19/13 [id number]   Current Outpatient Prescriptions  Medication Sig Dispense Refill  . cephALEXin (KEFLEX) 500 MG capsule Take 1 capsule (500 mg total) by mouth 4 (four) times daily.  28 capsule  0  . HYDROcodone-acetaminophen (NORCO/VICODIN) 5-325 MG per tablet Take 1 tablet by mouth every 6 (six) hours as needed.  10 tablet  0  . sulfamethoxazole-trimethoprim (SEPTRA DS) 800-160 MG per tablet Take 2 tablets by mouth 2 (two) times daily.  28 tablet  0    Exam:  BP 136/84  Pulse 78  Temp(Src) 98.1 F (36.7 C) (Oral)  Resp 16  SpO2 96% Gen: Well NAD HEENT: ,  MMM Lungs: Normal work of breathing. CTABL Heart: RRR no MRG Exts: Brisk capillary refill, warm and well perfused.  Right hand: Small puncture to laceration wound on the palmar right hand just proximally to the space between the second and third metacarpal heads. The laceration is approximately half a centimeter long and extends through the dermis. No extending erythema or tenderness  proximally or distally Sensation is slightly decreased to the distal palmar index finger. Motion is intact strength is intact capillary fill and pulses are intact.  Wound care:  Consent obtained and timeout performed. Skin cleaned with alcohol and cold spray applied and 4 mL of lidocaine and Marcaine solution 50/50 was injected into the area around the wound. The loose skin flap was debris did and the wound was copiously irrigated with sterile saline. Antibiotic ointment and a sterile dressing were applied.  No results found for this or any previous visit (from the past 24 hour(s)). Dg Hand 2 View Right  08/19/2013   CLINICAL DATA:  Right hand injury.  EXAM: RIGHT HAND - 2 VIEW  COMPARISON:  February 19, 2011.  FINDINGS: There is no evidence of fracture or dislocation. There is no evidence of arthropathy or other focal bone abnormality. Soft tissues are unremarkable.  IMPRESSION: Normal right hand.  No radiopaque foreign body seen.   Electronically Signed   By: Roque LiasJames  Green M.D.   On: 08/19/2013 09:35    Assessment and Plan: 43 y.o. male with right hand puncture wound.  Not yet infected. Tetanus is up-to-date. Wound is debrided and copiously irrigated. Patient was given 1 g intramuscular ceftriaxone as well as prescription for Bactrim and Keflex. Norco for pain control. Return to clinic if not significantly improving or if worsen.   Discussed warning signs or symptoms. Please see discharge instructions. Patient expresses understanding.  Rodolph BongEvan S Corey, MD 08/19/13 1013  Rodolph BongEvan S Corey, MD 08/19/13 [phone]

## 2013-08-19 NOTE — Discharge Instructions (Signed)
Thank you for coming in today. Follow up with Dr. [facility]ie [facility] practice or to the ER or back here if worsening.  Take bactrim antibiotics starting today and keflex antibiotics starting tomorrow.   Puncture Wound A puncture wound is an injury that extends through all layers of the skin and into the tissue beneath the skin (subcutaneous tissue). Puncture wounds become infected easily because germs often enter the body and go beneath the skin during the injury. Having a deep wound with a small entrance point makes it difficult for your caregiver to adequately clean the wound. This is especially true if you have stepped on a nail and it has passed through a dirty shoe or other situations where the wound is obviously contaminated. CAUSES  Many puncture wounds involve glass, nails, splinters, fish hooks, or other objects that enter the skin (foreign bodies). A puncture wound may also be caused by a human bite or animal bite. DIAGNOSIS  A puncture wound is usually diagnosed by your history and a physical exam. You may need to have an X-ray or an ultrasound to check for any foreign bodies still in the wound. TREATMENT   Your caregiver will clean the wound as thoroughly as possible. Depending on the location of the wound, a bandage (dressing) may be applied.  Your caregiver might prescribe antibiotic medicines.  You may need a follow-up visit to check on your wound. Follow all instructions as directed by your caregiver. HOME CARE INSTRUCTIONS   Change your dressing once per day, or as directed by your caregiver. If the dressing sticks, it may be removed by soaking the area in water.  If your caregiver has given you follow-up instructions, it is very important that you return for a follow-up appointment. Not following up as directed could result in a chronic or permanent injury, pain, and disability.  Only take over-the-counter or prescription medicines for pain, discomfort, or fever as directed by your  caregiver.  If you are given antibiotics, take them as directed. Finish them even if you start to feel better. You may need a tetanus shot if:  You cannot remember when you had your last tetanus shot.  You have never had a tetanus shot. If you got a tetanus shot, your arm may swell, get red, and feel warm to the touch. This is common and not a problem. If you need a tetanus shot and you choose not to have one, there is a rare chance of getting tetanus. Sickness from tetanus can be serious. You may need a rabies shot if an animal bite caused your puncture wound. SEEK MEDICAL CARE IF:   You have redness, swelling, or increasing pain in the wound.  You have red streaks going away from the wound.  You notice a bad smell coming from the wound or dressing.  You have yellowish-white fluid (pus) coming from the wound.  You are treated with an antibiotic for infection, but the infection is not getting better.  You notice something in the wound, such as rubber from your shoe, cloth, or another object.  You have a fever.  You have severe pain.  You have difficulty breathing.  You feel dizzy or faint.  You cannot stop vomiting.  You lose feeling, develop numbness, or cannot move a limb below the wound.  Your symptoms worsen. MAKE SURE YOU:  Understand these instructions.  Will watch your condition.  Will get help right away if you are not doing well or get worse. Document Released: 11/21/2004 Document  Revised: 05/06/2011 Document Reviewed: 07/31/2010 Exit[employer] Patient Information 2015 [place], [place]. This information is not intended to replace advice given to you by your health care provider. Make sure you discuss any questions you have with your health care provider.

## 2013-09-16 ENCOUNTER — Encounter (HOSPITAL_COMMUNITY): Payer: Self-pay | Admitting: Emergency Medicine

## 2013-09-16 ENCOUNTER — Emergency Department (HOSPITAL_COMMUNITY)
Admission: EM | Admit: 2013-09-16 | Discharge: 2013-09-16 | Disposition: A | Discharge status: Home / Self Care | Payer: PRIVATE HEALTH INSURANCE | Attending: Emergency Medicine | Admitting: Emergency Medicine

## 2013-09-16 DIAGNOSIS — Z87891 Personal history of nicotine dependence: Secondary | ICD-10-CM | POA: Insufficient documentation

## 2013-09-16 DIAGNOSIS — Z8719 Personal history of other diseases of the digestive system: Secondary | ICD-10-CM | POA: Insufficient documentation

## 2013-09-16 DIAGNOSIS — M5430 Sciatica, unspecified side: Secondary | ICD-10-CM | POA: Insufficient documentation

## 2013-09-16 DIAGNOSIS — Z792 Long term (current) use of antibiotics: Secondary | ICD-10-CM | POA: Insufficient documentation

## 2013-09-16 DIAGNOSIS — Z87442 Personal history of urinary calculi: Secondary | ICD-10-CM | POA: Insufficient documentation

## 2013-09-16 DIAGNOSIS — M5442 Lumbago with sciatica, left side: Secondary | ICD-10-CM

## 2013-09-16 MED ORDER — METHOCARBAMOL 500 MG PO TABS: 1000.0000 mg | ORAL_TABLET | Freq: Four times a day (QID) | ORAL | Status: AC

## 2013-09-16 MED ORDER — NAPROXEN 500 MG PO TABS: 500.0000 mg | ORAL_TABLET | Freq: Two times a day (BID) | ORAL | Status: AC

## 2013-09-16 MED ORDER — LIDOCAINE 5 % EX PTCH: 1.0000 | MEDICATED_PATCH | CUTANEOUS | Status: DC

## 2013-09-16 MED ORDER — TRAMADOL HCL 50 MG PO TABS: 50.0000 mg | ORAL_TABLET | Freq: Four times a day (QID) | ORAL | Status: DC | PRN

## 2013-09-16 NOTE — ED Notes (Signed)
Initial contact - pt A+Ox4, ambulatory with steady gait, reports c/o chronic back pain, and sudden onset worsening pain today while cleaning a pool.  Denies n/t to extremities, denies b/b changes or complaints.  Skin PWD.  NAD.

## 2013-09-16 NOTE — ED Notes (Signed)
Pt has hx of back pain.  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 Pt states sudden pain in his left lower side.  Hx of kidney stone but no kidney on that side.  No difficulty urinating

## 2013-09-16 NOTE — ED Notes (Signed)
Pt called at this time from [facility], no answer.

## 2013-09-16 NOTE — ED Provider Notes (Signed)
CSN: [id number]     Arrival date & time 09/16/13  1219 History  This chart was scribed for Rhea Bleacher PA-C  working with Merrie Roof, MD by Ashley Jacobs, ED scribe. This patient was seen in room WTR6/WTR6 and the patient's care was started at 1:01 PM.  First MD Initiated Contact with Patient 09/16/13 1256     Chief Complaint  Patient presents with  . Back Pain   (Consider location/radiation/quality/duration/timing/severity/associated sxs/prior Treatment) HPI HPI Comments: Carlos Taylor is a 43 y.o. male with history of absent left kidney -- who presents to the Emergency Department complaining of worsening left lumbar pain, onset five hours ago. Pt mentions the pain occurred gradually while cleaning a pool today. Pt states, for the past few days he had radiating "nerve" pain to his legs. Pt had difficulty walking due to severe pain.  The pain is worse with movement, bearing weight, and bending. Denies similar pain to prior kidney stones. Pt denies problems with voiding. Denies weakness and numbness. He has both of his kidneys on his right side. Denies having a MRI performed. He has a hx of chronic back pain in the exact same area as today for the past 7-8 years.   He is a pt at [employer].   Past Medical History  Diagnosis Date  . Kidney stones   . GERD (gastroesophageal reflux disease)    History reviewed. No pertinent past surgical history. History reviewed. No pertinent family history. History  Substance Use Topics  . Smoking status: Former Games developer  . Smokeless tobacco: Not on file  . Alcohol Use: No    Review of Systems  Constitutional: Negative for fever and unexpected weight change.  Gastrointestinal: Negative for constipation.       No bowel incontinence.    Genitourinary: Negative for hematuria, flank pain, enuresis and difficulty urinating.       Negative for urinary incontinence or retention  Musculoskeletal: Positive for back pain,  gait problem and myalgias.  Neurological: Negative for weakness and numbness.       Negative for saddle paresthesias       Allergies  Promethazine hcl  Home Medications   Prior to Admission medications   Medication Sig Start Date End Date Taking? Authorizing Provider  cephALEXin (KEFLEX) 500 MG capsule Take 1 capsule (500 mg total) by mouth 4 (four) times daily. 08/19/13   Rodolph Bong, MD  HYDROcodone-acetaminophen (NORCO/VICODIN) 5-325 MG per tablet Take 1 tablet by mouth every 6 (six) hours as needed. 08/19/13   Rodolph Bong, MD  sulfamethoxazole-trimethoprim (SEPTRA DS) 800-160 MG per tablet Take 2 tablets by mouth 2 (two) times daily. 08/19/13   Rodolph Bong, MD   BP 147/91  Pulse 68  Temp(Src) 98 F (36.7 C) (Oral)  Resp 16  SpO2 96%  Physical Exam  Nursing note and vitals reviewed. Constitutional: He appears well-developed and well-nourished.  HENT:  Head: Normocephalic and atraumatic.  Eyes: Conjunctivae are normal.  Neck: Normal range of motion.  Abdominal: Soft. There is no tenderness. There is no CVA tenderness.  Musculoskeletal: Normal range of motion.       Right hip: Normal.       Left hip: Normal.       Cervical back: Normal.       Thoracic back: Normal.       Lumbar back: He exhibits tenderness and spasm. He exhibits no bony tenderness.       Back:  No  step-off noted with palpation of spine.   Neurological: He is alert. He has normal reflexes. No sensory deficit. He exhibits normal muscle tone.  5/5 strength in entire lower extremities bilaterally. No sensation deficit.   Skin: Skin is warm and dry.  Psychiatric: He has a normal mood and affect.    ED Course  Procedures (including critical care time) DIAGNOSTIC STUDIES: Oxygen Saturation is 96% on room air, normal by my interpretation.    COORDINATION OF CARE:  1:04 PM Discussed course of care with pt which includes pain medication. I advised pt to follow up with his PCP.  Pt understands and  agrees.   Labs Review Labs Reviewed - No data to display  Imaging Review No results found.   EKG Interpretation None      1:10 PM Patient seen and examined. Work-up initiated. Medications ordered.   Vital signs reviewed and are as follows: Filed Vitals:   09/16/13 1232  BP: 147/91  Pulse: 68  Temp: 98 F (36.7 C)  Resp: 16    No red flag s/s of low back pain. Patient was counseled on back pain precautions and told to do activity as tolerated but do not lift, push, or pull heavy objects more than 10 pounds for the next week.  Patient counseled to use ice or heat on back for no longer than 15 minutes every hour.   Patient prescribed muscle relaxer and counseled on proper use of muscle relaxant medication.    Patient prescribed narcotic pain medicine and counseled on proper use of narcotic pain medications. Counseled not to combine this medication with others containing tylenol.   Urged patient not to drink alcohol, drive, or perform any other activities that requires focus while taking either of these medications.  Patient urged to follow-up with PCP if pain does not improve with treatment and rest or if pain becomes recurrent. Urged to return with worsening severe pain, loss of bowel or bladder control, trouble walking.   The patient verbalizes understanding and agrees with the plan.   MDM   Final diagnoses:  Left-sided low back pain with left-sided sciatica   Patient with back pain in same area for past several years -- but worse today. No neurological deficits. Patient is ambulatory. No warning symptoms of back pain including: loss of bowel or bladder control, night sweats, waking from sleep with back pain, unexplained fevers or weight loss, h/o cancer, IVDU, recent trauma. No concern for cauda equina, epidural abscess, or other serious cause of back pain. Conservative measures such as rest, ice/heat and pain medicine indicated with PCP follow-up if no improvement with  conservative management.   I personally performed the services described in this documentation, which was scribed in my presence. The recorded information has been reviewed and is accurate.     Renne CriglerJoshua Khori Underberg, PA-C 09/16/13 [address][id number]  Jaran Sainz, [place] JerseyPA-C 09/16/13 1315

## 2013-09-16 NOTE — Discharge Instructions (Signed)
Please read and follow all provided instructions.  Your diagnoses today include:  1. Left-sided low back pain with left-sided sciatica     Tests performed today include:  Vital signs - see below for your results today  Medications prescribed:   Tramadol - narcotic-like pain medication  DO NOT drive or perform any activities that require you to be awake and alert because this medicine can make you drowsy.    Naproxen - anti-inflammatory pain medication  Do not exceed 500mg  naproxen every 12 hours, take with food  You have been prescribed an anti-inflammatory medication or NSAID. Take with food. Take smallest effective dose for the shortest duration needed for your pain. Stop taking if you experience stomach pain or vomiting.    Robaxin (methocarbamol) - muscle relaxer medication  DO NOT drive or perform any activities that require you to be awake and alert because this medicine can make you drowsy.   Take any prescribed medications only as directed.  Home care instructions:   Follow any educational materials contained in this packet  Please rest, use ice or heat on your back for the next several days  Do not lift, push, pull anything more than 10 pounds for the next week  Follow-up instructions: Please follow-up with your primary care provider in the next 1 week for further evaluation of your symptoms.   Return instructions:  SEEK IMMEDIATE MEDICAL ATTENTION IF YOU HAVE:  New numbness, tingling, weakness, or problem with the use of your arms or legs  Severe back pain not relieved with medications  Loss control of your bowels or bladder  Increasing pain in any areas of the body (such as chest or abdominal pain)  Shortness of breath, dizziness, or fainting.   Worsening nausea (feeling sick to your stomach), vomiting, fever, or sweats  Any other emergent concerns regarding your health   Additional Information:  Your vital signs today were: BP 147/91   Pulse 68    Temp(Src) 98 F (36.7 C) (Oral)   Resp 16   SpO2 96% If your blood pressure (BP) was elevated above 135/85 this visit, please have this repeated by your doctor within one month. --------------

## 2013-09-17 NOTE — ED Provider Notes (Signed)
Medical screening examination/treatment/procedure(s) were performed by non-physician practitioner and as supervising physician I was immediately available for consultation/collaboration.   Ngai Parcell David Johnattan Strassman III, MD 09/17/13 0832 

## 2014-10-20 ENCOUNTER — Encounter (HOSPITAL_COMMUNITY): Payer: Self-pay | Admitting: Emergency Medicine

## 2014-10-20 ENCOUNTER — Emergency Department (INDEPENDENT_AMBULATORY_CARE_PROVIDER_SITE_OTHER): Payer: PRIVATE HEALTH INSURANCE

## 2014-10-20 ENCOUNTER — Emergency Department (HOSPITAL_COMMUNITY)
Admission: EM | Admit: 2014-10-20 | Discharge: 2014-10-20 | Disposition: A | Discharge status: Home / Self Care | Payer: PRIVATE HEALTH INSURANCE | Source: Home / Self Care | Attending: Family Medicine | Admitting: Family Medicine

## 2014-10-20 DIAGNOSIS — M791 Myalgia: Secondary | ICD-10-CM | POA: Diagnosis not present

## 2014-10-20 DIAGNOSIS — K5900 Constipation, unspecified: Secondary | ICD-10-CM

## 2014-10-20 DIAGNOSIS — M7918 Myalgia, other site: Secondary | ICD-10-CM

## 2014-10-20 MED ORDER — GI COCKTAIL ~~LOC~~
ORAL | Status: AC
  Filled 2014-10-20: qty 30

## 2014-10-20 MED ORDER — GI COCKTAIL ~~LOC~~
30.0000 mL | Freq: Once | ORAL | Status: AC
  Administered 2014-10-20: 30 mL via ORAL

## 2014-10-20 MED ORDER — DICLOFENAC SODIUM 75 MG PO TBEC: 75.0000 mg | DELAYED_RELEASE_TABLET | Freq: Two times a day (BID) | ORAL | Status: DC

## 2014-10-20 MED ORDER — PEG 3350-KCL-NA BICARB-NACL 420 G PO SOLR: 4000.0000 mL | Freq: Once | ORAL | Status: DC

## 2014-10-20 MED ORDER — PANTOPRAZOLE SODIUM 40 MG PO TBEC: 40.0000 mg | DELAYED_RELEASE_TABLET | Freq: Every day | ORAL | Status: DC

## 2014-10-20 NOTE — ED Provider Notes (Signed)
CSN: [id number]     Arrival date & time 10/20/14  1811 History   First MD Initiated Contact with Patient 10/20/14 1853     Chief Complaint  Patient presents with  . Abdominal Pain  . Chest Pain   (Consider location/radiation/quality/duration/timing/severity/associated sxs/prior Treatment) HPI  One day of abd pain, the patient endorses a feeling of general bloating and abdominal discomfort for several weeks. No change in exertion at work. Eczema some benefit. Pain is typically in the left lower quadrant. Comes and goes. Patient stated it feels like gas. States he is too small soft bowel movements typically every day. Denies nausea, vomiting, rash, dysuria, frequency, headache, fever, chest pain, shortness of breath, palpitations, hematochezia, hematemesis. Patient states he is extremely stressed right now she is going through a separation from his wife and has been so for several weeks.  Past Medical History  Diagnosis Date  . Kidney stones   . GERD (gastroesophageal reflux disease)    History reviewed. No pertinent past surgical history. Family History  Problem Relation Age of Onset  . Diabetes Father   . Hypertension Father   . Hypertension Other    Social History  Substance Use Topics  . Smoking status: Former Games developer  . Smokeless tobacco: None  . Alcohol Use: No    Review of Systems Per HPI with all other pertinent systems negative.   Allergies  Promethazine hcl  Home Medications   Prior to Admission medications   Medication Sig Start Date End Date Taking? Authorizing Provider  HYDROcodone-acetaminophen (NORCO/VICODIN) 5-325 MG per tablet Take 1 tablet by mouth every 6 (six) hours as needed for moderate pain.   Yes Historical Provider, MD  PARoxetine (PAXIL) 10 MG tablet Take 10 mg by mouth daily.   Yes Historical Provider, MD  diclofenac (VOLTAREN) 75 MG EC tablet Take 1 tablet (75 mg total) by mouth 2 (two) times daily. 10/20/14   Ozella Rocks, MD  methocarbamol  (ROBAXIN) 500 MG tablet Take 2 tablets (1,000 mg total) by mouth 4 (four) times daily. 09/16/13   Renne Crigler, PA-C  naproxen (NAPROSYN) 500 MG tablet Take 1 tablet (500 mg total) by mouth 2 (two) times daily. 09/16/13   Renne Crigler, PA-C  pantoprazole (PROTONIX) 40 MG tablet Take 1 tablet (40 mg total) by mouth daily. 10/20/14   Ozella Rocks, MD  polyethylene glycol-electrolytes (NULYTELY/GOLYTELY) 420 G solution Take 4,000 mLs by mouth once. 10/20/14   Ozella Rocks, MD   Meds Ordered and Administered this Visit   Medications  gi cocktail (Maalox,Lidocaine,Donnatal) (not administered)    BP 136/77 mmHg  Pulse 77  Temp(Src) 98.1 F (36.7 C) (Oral)  Resp 18  SpO2 96% No data found.   Physical Exam Physical Exam  Constitutional: oriented to person, place, and time. appears well-developed and well-nourished. No distress.  HENT:  Head: Normocephalic and atraumatic.  Eyes: EOMI. PERRL.  Neck: Normal range of motion.  Cardiovascular: RRR, no m/r/g, 2+ distal pulses,  Pulmonary/Chest: Effort normal and breath sounds normal. No respiratory distress.  Abdominal: Abdomen mildly distended with normoactive bowel sounds and stool felt in the left lower and upper quadrants.  Musculoskeletal: Normal range of motion. Non ttp, no effusion.  Neurological: alert and oriented to person, place, and time.  Skin: Skin is warm. No rash noted. non diaphoretic.  Psychiatric: normal mood and affect. behavior is normal. Judgment and thought content normal.   ED Course  Procedures (including critical care time)  Labs Review Labs Reviewed -  No data to display  Imaging Review Dg Abd 1 View  10/20/2014   CLINICAL DATA:  Crampy left lower quadrant abdominal pain for 2 days.  EXAM: ABDOMEN - 1 VIEW  COMPARISON:  CT abdomen pelvis 11/05/2011  FINDINGS: Large amount of stool within the ascending colon and rectum. Paucity of small bowel gas. Supine evaluation limited for the detection of free  intraperitoneal air. Pelvic phleboliths. SI joints are unremarkable.  IMPRESSION: Large amount of stool within the cecum, ascending colon and rectum as can be seen with constipation.  Paucity of small bowel gas limits evaluation. No definite evidence for overt obstruction.   Electronically Signed   By: Annia Belt M.D.   On: 10/20/2014 19:37     Visual Acuity Review  Right Eye Distance:   Left Eye Distance:   Bilateral Distance:    Right Eye Near:   Left Eye Near:    Bilateral Near:         MDM   1. Constipation, unspecified constipation type   2. Musculoskeletal pain    Change from Nexium to Protonix for reflux. Use Voltaren, continue Robaxin, start heat massage for musculoskeletal pain of the left upper chest wall which may be due to muscle strain and/or costochondritis. Patient given prescription for GoLYTELY for constipation. Unlikely the patient's symptoms are due to other acute abdominal process. They should go to the ED if he gets worse.    Ozella Rocks, MD 10/20/14 2007

## 2014-10-20 NOTE — ED Notes (Signed)
C/o abd and chest pain States he has left side chest pain States left lower abd pain  Bowel movements are slightly normal Urine was checked a week ago No meds taking

## 2014-10-20 NOTE — Discharge Instructions (Signed)
You are likely suffering from muscle strain and possible costochondritis of the chest as well as constipation and reflux Please take the Golytely and take the day off of work Please use the voltaren for the chest pain Please use the protonix instead of the nexium Please apply a heating pad and stretch your upper chest multiple times per day

## 2015-04-05 ENCOUNTER — Encounter: Payer: Self-pay | Admitting: Gastroenterology

## 2015-05-23 ENCOUNTER — Encounter: Payer: Self-pay | Admitting: Gastroenterology

## 2015-05-23 ENCOUNTER — Ambulatory Visit (INDEPENDENT_AMBULATORY_CARE_PROVIDER_SITE_OTHER): Payer: Managed Care, Other (non HMO) | Admitting: Gastroenterology

## 2015-05-23 VITALS — BP 140/90 | HR 83 | Ht 72.0 in | Wt 229.0 lb

## 2015-05-23 DIAGNOSIS — R1084 Generalized abdominal pain: Secondary | ICD-10-CM

## 2015-05-23 DIAGNOSIS — R140 Abdominal distension (gaseous): Secondary | ICD-10-CM

## 2015-05-23 DIAGNOSIS — K429 Umbilical hernia without obstruction or gangrene: Secondary | ICD-10-CM

## 2015-05-23 DIAGNOSIS — R6881 Early satiety: Secondary | ICD-10-CM

## 2015-05-23 DIAGNOSIS — K219 Gastro-esophageal reflux disease without esophagitis: Secondary | ICD-10-CM | POA: Diagnosis not present

## 2015-05-23 NOTE — Progress Notes (Signed)
[facility] Consult Note:  History: Carlos Taylor 05/23/2015  Referring physician: Kaleen MaskELKINS,WILSON OLIVER, MD  Reason for consult/chief complaint: Constipation and Gastroesophageal Reflux   Subjective HPI:  Carlos Taylor was referred from an outside urgent care center for a variety of GI symptoms. He reports several months of generalized bandlike mid abdominal pain with associated constipation, bloating, and failure to lose weight. He also reports many years of pyrosis and regurgitation that has been worsening, with associated early satiety but no dysphagia. His heartburn symptoms were much better on a PPI, but he stopped taking it because he saw a commercial indicating possible kidney problems related to this medicine. He admits that he feels much better if he drink more water and eats healthy. He denies rectal bleeding or unanticipated weight loss. Lastly, for the last year, he has a painful umbilical hernia, worse with exertion.  ROS:  Review of Systems  Constitutional: Negative for appetite change and unexpected weight change.  HENT: Negative for mouth sores and voice change.   Eyes: Negative for pain and redness.  Respiratory: Negative for cough and shortness of breath.   Cardiovascular: Negative for chest pain and palpitations.  Genitourinary: Negative for dysuria and hematuria.  Musculoskeletal: Positive for back pain. Negative for myalgias and arthralgias.  Skin: Negative for pallor and rash.  Neurological: Negative for weakness and headaches.  Hematological: Negative for adenopathy.    He has left foot pain from bone spurs Past Medical History: Past Medical History  Diagnosis Date  . Kidney stones   . GERD (gastroesophageal reflux disease)      Past Surgical History: No past surgical history on file. No prior surgery  Family History: Family History  Problem Relation Age of Onset  . Diabetes Father   . Hypertension Father   . Hypertension Other      Social History: Social History   Social History  . Marital Status: Married    Spouse Name: N/A  . Number of Children: N/A  . Years of Education: N/A   Social History Main Topics  . Smoking status: Former Smoker    Quit date: 05/23/2010  . Smokeless tobacco: Never Used  . Alcohol Use: No  . Drug Use: No  . Sexual Activity: Yes   Other Topics Concern  . None   Social History Narrative    Allergies: Allergies  Allergen Reactions  . Promethazine Hcl     Rash to the lip    Outpatient Meds: Current Outpatient Prescriptions  Medication Sig Dispense Refill  . HYDROcodone-acetaminophen (NORCO/VICODIN) 5-325 MG per tablet Take 1 tablet by mouth every 6 (six) hours as needed for moderate pain.    . methocarbamol (ROBAXIN) 500 MG tablet Take 2 tablets (1,000 mg total) by mouth 4 (four) times daily. (Patient taking differently: Take 1,000 mg by mouth as needed. ) 20 tablet 0  . naproxen (NAPROSYN) 500 MG tablet Take 1 tablet (500 mg total) by mouth 2 (two) times daily. 20 tablet 0   No current facility-administered medications for this visit.      ___________________________________________________________________ Objective  Exam:  BP 140/90 mmHg  Pulse 83  Ht 6' (1.829 m)  Wt 229 lb (103.874 kg)  BMI 31.05 kg/m2   General: this is a(n) Obese middle-aged white man, good muscle mass   Eyes: sclera anicteric, no redness  ENT: oral mucosa moist without lesions, no cervical or supraclavicular lymphadenopathy, good dentition  CV: RRR without murmur, S1/S2, no JVD, no peripheral edema  Resp: clear to auscultation bilaterally,  normal RR and effort noted  GI: soft, mild umbilical tenderness at a small reducible umbilical hernia, with active bowel sounds. No guarding or palpable organomegaly noted.  Skin; warm and dry, no rash or jaundice noted  Neuro: awake, alert and oriented x 3. Normal gross motor function and fluent speech   Assessment: Encounter Diagnoses   Name Primary?  . Generalized abdominal pain Yes  . Abdominal bloating   . Gastroesophageal reflux disease, esophagitis presence not specified   . Early satiety   . Umbilical hernia without obstruction and without gangrene     Difficult to tell how or if his symptoms for together any unifying diagnosis.  I suspect much of it may be from poor diet and lifestyle choices. We discussed the need to rule out latency and peptic ulcer, though the overall suspicion is low.  Plan:  EGD and colonoscopy.  The benefits and risks of the planned procedure were described in detail with the patient or (when appropriate) their health care proxy.  Risks were outlined as including, but not limited to, bleeding, infection, perforation, adverse medication reaction leading to cardiac or pulmonary decompensation, or pancreatitis (if ERCP).  The limitation of incomplete mucosal visualization was also discussed.  No guarantees or warranties were given.   High fiber diet and increase water intake and exercise.  After endoscopic workup, surgical referral for umbilical hernia.  Thank you for the courtesy of this consult.  Please call me with any questions or concerns.  Carlos Taylor

## 2015-05-23 NOTE — Patient Instructions (Signed)

## 2015-06-19 ENCOUNTER — Ambulatory Visit (AMBULATORY_SURGERY_CENTER): Payer: Managed Care, Other (non HMO) | Admitting: Gastroenterology

## 2015-06-19 ENCOUNTER — Telehealth: Payer: Self-pay | Admitting: Gastroenterology

## 2015-06-19 ENCOUNTER — Encounter: Payer: Self-pay | Admitting: Gastroenterology

## 2015-06-19 VITALS — BP 132/81 | HR 77 | Temp 99.6°F | Resp 16 | Ht 72.0 in | Wt 229.0 lb

## 2015-06-19 DIAGNOSIS — K219 Gastro-esophageal reflux disease without esophagitis: Secondary | ICD-10-CM

## 2015-06-19 DIAGNOSIS — K5900 Constipation, unspecified: Secondary | ICD-10-CM

## 2015-06-19 DIAGNOSIS — R1084 Generalized abdominal pain: Secondary | ICD-10-CM | POA: Diagnosis not present

## 2015-06-19 DIAGNOSIS — K209 Esophagitis, unspecified without bleeding: Secondary | ICD-10-CM

## 2015-06-19 MED ORDER — SODIUM CHLORIDE 0.9 % IV SOLN: 500.0000 mL | INTRAVENOUS | Status: DC

## 2015-06-19 NOTE — Op Note (Signed)
[facility] Patient Name: Carlos SinclairWilliam Taylor Procedure Date: 06/19/2015 2:30 PM MRN: [id number] Endoscopist: Sherilyn CooterHenry L. Myrtie Neitheranis , MD Age: 45 Date of Birth: 06/09/1970 Gender: Male Procedure:                Upper GI endoscopy Indications:              Epigastric abdominal pain, Heartburn, Suspected                            esophageal reflux Medicines:                Monitored Anesthesia Care Procedure:                Pre-Anesthesia Assessment:                           - Prior to the procedure, a History and Physical                            was performed, and patient medications and                            allergies were reviewed. The patient's tolerance of                            previous anesthesia was also reviewed. The risks                            and benefits of the procedure and the sedation                            options and risks were discussed with the patient.                            All questions were answered, and informed consent                            was obtained. Prior Anticoagulants: The patient has                            taken no previous anticoagulant or antiplatelet                            agents. ASA Grade Assessment: II - A patient with                            mild systemic disease. After reviewing the risks                            and benefits, the patient was deemed in                            satisfactory condition to undergo the procedure.  After obtaining informed consent, the endoscope was                            passed under direct vision. Throughout the                            procedure, the patient's blood pressure, pulse, and                            oxygen saturations were monitored continuously. The                            Model GIF-HQ190 [id number]) scope was introduced                            through the mouth, and advanced to the second part   of duodenum. The upper GI endoscopy was                            accomplished without difficulty. The patient                            tolerated the procedure well. Scope In: Scope Out: Findings:                 LA Grade A (one or more mucosal breaks less than 5                            mm, not extending between tops of 2 mucosal folds)                            esophagitis with no bleeding was found.                           Mucosal changes including ringed esophagus,                            longitudinal furrows and small-caliber esophagus                            were found in the mid and distal esophagus.                            Esophageal findings were graded using the                            Eosinophilic Esophagitis Endoscopic Reference Score                            (EoE-EREFS) as: Rings Grade 2 Moderate (distinct                            rings that do not occlude passage of diagnostic  8-10 mm endoscope) and Furrows Grade 1 Present                            (vertical lines with or without visible depth).                            Biopsies were taken with a cold forceps for                            histology.                           A small amount of food (residue) was found in the                            gastric body.                           The examined duodenum was normal. Complications:            No immediate complications. Estimated Blood Loss:     Estimated blood loss: none. Impression:               - LA Grade A reflux esophagitis.                           - Esophageal mucosal changes suggestive of                            eosinophilic esophagitis. Biopsied.                           - A small amount of food (residue) in the stomach.                           - Normal examined duodenum. Recommendation:           - Await pathology results.                           - No repeat upper endoscopy.                            - Resume previous diet.                           - Continue present medications. Henry L. Myrtie Neither, MD 06/19/2015 3:16:15 PM This report has been signed electronically.

## 2015-06-19 NOTE — Telephone Encounter (Signed)
[facility]'s [facility] hernia. Routine priority Thank you  - HD

## 2015-06-19 NOTE — Op Note (Signed)
[facility] Patient Name: Carlos SinclairWilliam Taylor Procedure Date: 06/19/2015 2:29 PM MRN: [id number] Endoscopist: Sherilyn CooterHenry L. Myrtie Neitheranis , MD Age: [age] Date of Birth: 09/16/1970 Gender: Male Procedure:                Colonoscopy Indications:              Generalized abdominal pain, Constipation Medicines:                Monitored Anesthesia Care Procedure:                Pre-Anesthesia Assessment:                           - Prior to the procedure, a History and Physical                            was performed, and patient medications and                            allergies were reviewed. The patient's tolerance of                            previous anesthesia was also reviewed. The risks                            and benefits of the procedure and the sedation                            options and risks were discussed with the patient.                            All questions were answered, and informed consent                            was obtained. Prior Anticoagulants: The patient has                            taken no previous anticoagulant or antiplatelet                            agents. ASA Grade Assessment: II - A patient with                            mild systemic disease. After reviewing the risks                            and benefits, the patient was deemed in                            satisfactory condition to undergo the procedure.                           After obtaining informed consent, the colonoscope  was passed under direct vision. Throughout the                            procedure, the patient's blood pressure, pulse, and                            oxygen saturations were monitored continuously. The                            Model CF-HQ190L [id number]) scope was introduced                            through the anus and advanced to the the terminal                            ileum. The colonoscopy was performed without                difficulty. The patient tolerated the procedure                            well. The quality of the bowel preparation was                            good. The ileum, ileocecal valve, appendiceal                            orifice, and rectum were photographed. Scope In: 2:54:32 PM Scope Out: 3:03:45 PM Scope Withdrawal Time: 0 hours 7 minutes 44 seconds  Total Procedure Duration: 0 hours 9 minutes 13 seconds  Findings:                 The perianal and digital rectal examinations were                            normal.                           The entire examined colon appeared normal on direct                            and retroflexion views. Complications:            No immediate complications. Estimated Blood Loss:     Estimated blood loss: none. Impression:               - The entire examined colon is normal on direct and                            retroflexion views.                           - No specimens collected. Recommendation:           - Patient has a contact number available for                            emergencies. The signs  and symptoms of potential                            delayed complications were discussed with the                            patient. Return to normal activities tomorrow.                            Written discharge instructions were provided to the                            patient.                           - Resume previous diet.                           - Continue present medications.                           - Repeat colonoscopy in 10 years for screening                            purposes.                           - Healthy diet and lifestyle choices were                            reinforced. Carlos Taylor L. Myrtie Neither, MD 06/19/2015 3:19:17 PM This report has been signed electronically.

## 2015-06-19 NOTE — Progress Notes (Signed)
Called to room to assist during endoscopic procedure.  Patient ID and intended procedure confirmed with present staff. Received instructions for my participation in the procedure from the performing physician.  

## 2015-06-19 NOTE — Patient Instructions (Addendum)

## 2015-06-19 NOTE — Progress Notes (Signed)
A/ox3 pleased with MAC, report to Jane RN 

## 2015-06-20 ENCOUNTER — Telehealth: Payer: Self-pay | Admitting: *Deleted

## 2015-06-20 NOTE — Telephone Encounter (Signed)
  Follow up Call-  Call back number 06/19/2015  Post procedure Call Back phone  # [phone]  Permission to leave phone message Yes     Patient questions:  Do you have a fever, pain , or abdominal swelling? No. Pain Score  0 *  Have you tolerated food without any problems? Yes.    Have you been able to return to your normal activities? Yes.    Do you have any questions about your discharge instructions: Diet   No. Medications  No. Follow up visit  No.  Do you have questions or concerns about your Care? No.  Actions: * If pain score is 4 or above: No action needed, pain <4.

## 2015-06-20 NOTE — Telephone Encounter (Signed)
Records have been faxed to [facility]. Pt has been notified and aware.

## 2015-06-20 NOTE — Telephone Encounter (Signed)
Pt scheduled for office visit for 06-28-2015 @10 :30 am. Mr Carlos Taylor is aware.

## 2015-06-28 ENCOUNTER — Telehealth: Payer: Self-pay | Admitting: Gastroenterology

## 2015-06-28 NOTE — Telephone Encounter (Signed)
The pt was advised per Dr Myrtie Neitheranis to avoid shellfish and soy.  He will try to avoid these as much as possible and call back with any further questions.

## 2015-08-07 ENCOUNTER — Ambulatory Visit: Payer: Managed Care, Other (non HMO) | Admitting: Gastroenterology

## 2017-02-08 IMAGING — DX DG ABDOMEN 1V
1 series · 1 of 1 positions shown · non-contrast
Comparison: CT abdomen pelvis 11/05/2011

CLINICAL DATA: Crampy left lower quadrant abdominal pain for 2
days.

EXAM:
ABDOMEN - 1 VIEW

[abdomen kub]
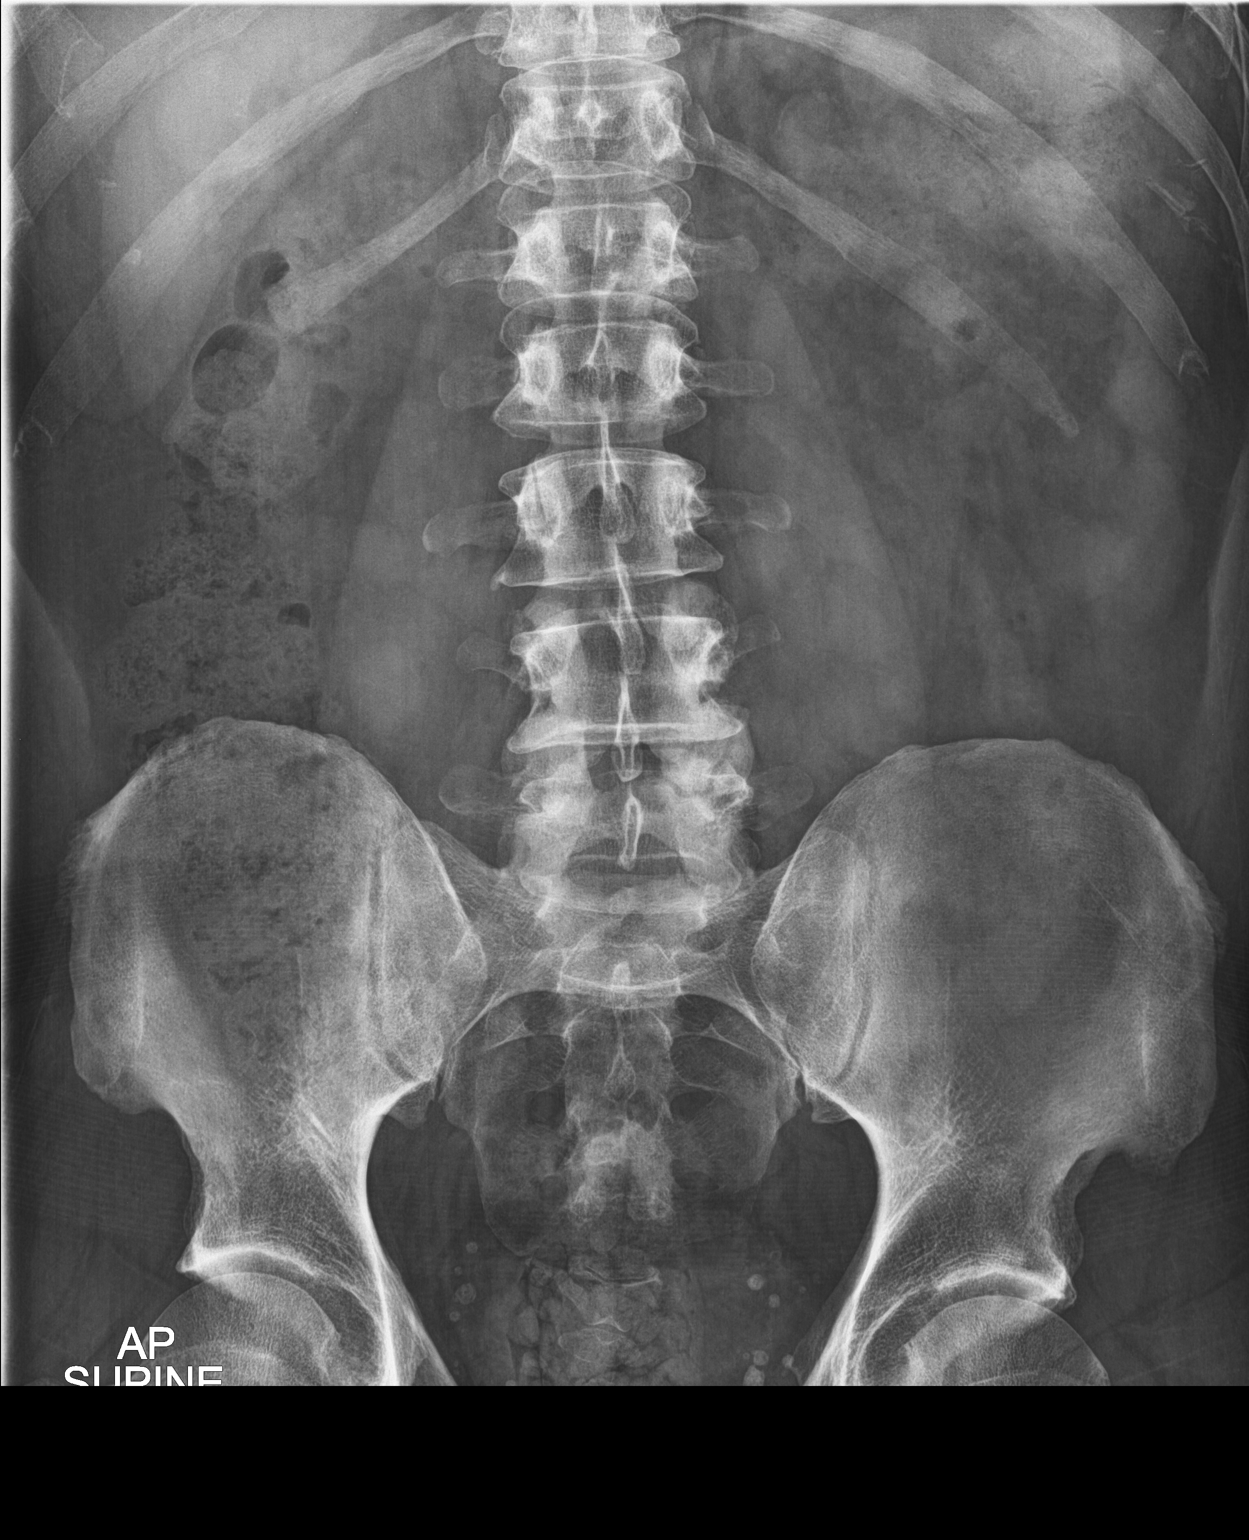

[1 of 1 positions shown; findings below may reference images not displayed]

FINDINGS: Large amount of stool within the ascending colon and rectum. Paucity
of small bowel gas. Supine evaluation limited for the detection of
free intraperitoneal air. Pelvic phleboliths. SI joints are
unremarkable.
IMPRESSION: Large amount of stool within the cecum, ascending colon and rectum
as can be seen with constipation.

Paucity of small bowel gas limits evaluation. No definite evidence
for overt obstruction.
